# Patient Record
Sex: Female | Born: 2000 | Race: White | Hispanic: No | Marital: Single | Smoking: Never smoker
Health system: Southern US, Community
[De-identification: ages and names within clinical notes are randomized; demographics above are authoritative.]

## PROBLEM LIST (undated history)

## (undated) HISTORY — PX: TONSILLECTOMY: SUR1361

---

## 2018-02-26 ENCOUNTER — Other Ambulatory Visit: Payer: Self-pay

## 2018-02-26 ENCOUNTER — Encounter: Payer: Self-pay | Admitting: Obstetrics & Gynecology

## 2018-02-26 ENCOUNTER — Ambulatory Visit (INDEPENDENT_AMBULATORY_CARE_PROVIDER_SITE_OTHER): Payer: 59 | Admitting: Obstetrics & Gynecology

## 2018-02-26 ENCOUNTER — Emergency Department (HOSPITAL_BASED_OUTPATIENT_CLINIC_OR_DEPARTMENT_OTHER)
Admission: EM | Admit: 2018-02-26 | Discharge: 2018-02-26 | Disposition: A | Discharge status: Home / Self Care | Payer: 59 | Attending: Emergency Medicine | Admitting: Emergency Medicine

## 2018-02-26 ENCOUNTER — Encounter (HOSPITAL_BASED_OUTPATIENT_CLINIC_OR_DEPARTMENT_OTHER): Payer: Self-pay | Admitting: *Deleted

## 2018-02-26 VITALS — BP 113/56 | HR 72 | Ht 68.0 in | Wt 186.0 lb

## 2018-02-26 DIAGNOSIS — R55 Syncope and collapse: Secondary | ICD-10-CM | POA: Insufficient documentation

## 2018-02-26 DIAGNOSIS — Z3202 Encounter for pregnancy test, result negative: Secondary | ICD-10-CM

## 2018-02-26 DIAGNOSIS — Z79899 Other long term (current) drug therapy: Secondary | ICD-10-CM | POA: Insufficient documentation

## 2018-02-26 DIAGNOSIS — N912 Amenorrhea, unspecified: Secondary | ICD-10-CM | POA: Diagnosis not present

## 2018-02-26 DIAGNOSIS — N970 Female infertility associated with anovulation: Secondary | ICD-10-CM | POA: Diagnosis not present

## 2018-02-26 LAB — POCT URINE PREGNANCY: Preg Test, Ur: NEGATIVE

## 2018-02-26 MED ORDER — MEDROXYPROGESTERONE ACETATE 5 MG PO TABS: 5.0000 mg | ORAL_TABLET | Freq: Every day | ORAL | 0 refills | Status: DC

## 2018-02-26 NOTE — Progress Notes (Signed)
Subjective:     Jasmine Stevens is a 17 y.o. female here for a routine exam. LMP ~end 07/2017 Current complaints: no menses for 6 months.  [place] Pt is [employer]. Menarche at age [age]years . Cycles not reg for 2 1/2 years. Pt had menses monthly for 1 year only. Pt had very minimal molimina sx but, she did have mood issues.  Pt denies excessive hair growth. She does not shave or wax hair in any location.   Gynecologic History Patient's last menstrual period was 07/08/2017. Contraception: abstinence Last Pap: n/a.    Obstetric History OB History  Gravida Para Term Preterm AB Living  0 0 0 0 0 0  SAB TAB Ectopic Multiple Live Births  0 0 0 0 0   The following portions of the patient's history were reviewed and updated as appropriate: allergies, current medications, past family history, past medical history, past social history, past surgical history and problem list.  Review of Systems Pertinent items are noted in HPI.    Objective:  BP (!) 113/56   Pulse 72   Ht 5\' 8"  (1.727 m)   Wt 186 lb (84.4 kg)   LMP 07/08/2017   BMI 28.28 kg/m    CONSTITUTIONAL: Well-developed, well-nourished female in no acute distress.  HENT:  Normocephalic, atraumatic EYES: Conjunctivae and EOM are normal. No scleral icterus.  NECK: Normal range of motion SKIN: Skin is warm and dry. No rash noted. Not diaphoretic.No pallor. NEUROLGIC: Alert and oriented to person, place, and time. Normal coordination.  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 This could still be related to a immature HPA as pt begin menses late and only had monthly cycles for 1 year   Plan:   Labs: TSH,. Prolactin, DHEAS, FSH F/u in 3-4 weeks Pelvic US  Total face-to-face time with patient was 25 min.  Greater than 50% was spent in counseling and coordination of care with the patient.  Homer Pfeifer L. Harraway-Smith, M.D., Evern CoreFACOG

## 2018-02-26 NOTE — Patient Instructions (Signed)

## 2018-02-26 NOTE — ED Triage Notes (Addendum)
Patient from [facility].  ED staff called for rapid response.  States that she was having blood drawn and had vasovagal event.  Patient alert and oriented upon ED staff arrival.  Reports loss of consciousness with head injury.  [facility]; HR 56; 99%on RA, [id number]

## 2018-02-26 NOTE — Addendum Note (Signed)
Addended by: Lorelle GibbsWILSON, Vashon Arch L on: 02/26/2018 05:00 PM   Modules accepted: Orders

## 2018-02-26 NOTE — Progress Notes (Signed)
Pt states that she hasn't had a period since the end of April 2019.

## 2018-02-26 NOTE — ED Provider Notes (Signed)
[facility] EMERGENCY DEPARTMENT Provider Note   CSN: [id number] Arrival date & time: 02/26/18  1716     History   Chief Complaint Chief Complaint  Patient presents with  . Loss of Consciousness    HPI Jasmine Stevens is a 17 y.o. female.  Patient is a healthy 17 year old female who was giving blood today upstairs in the clinic when she started to feel lightheaded and woozy.  She had a brief episode of losing consciousness but then thought she was okay and try to get up and walk and passed out again hitting her head on the floor.  She currently has no complaints and states she is feeling better.  She had not eaten much today either.  The history is provided by the patient.  Loss of Consciousness   This is a new problem. The current episode started less than 1 hour ago. The problem occurs constantly. The problem has been resolved. She lost consciousness for a period of less than one minute. Associated with: occured after a blood draw today upstairs in the doctors office. Associated symptoms include diaphoresis, light-headedness and nausea. Treatments tried: lying down. The treatment provided significant relief.    History reviewed. No pertinent past medical history.  There are no active problems to display for this patient.   Past Surgical History:  Procedure Laterality Date  . TONSILLECTOMY       OB History    Gravida  0   Para  0   Term  0   Preterm  0   AB  0   Living  0     SAB  0   TAB  0   Ectopic  0   Multiple  0   Live Births  0            Home Medications    Prior to Admission medications   Medication Sig Start Date End Date Taking? Authorizing Provider  loratadine (CLARITIN) 10 MG tablet Take 10 mg by mouth daily.   Yes [provider]  medroxyPROGESTERone (PROVERA) 5 MG tablet Take 1 tablet (5 mg total) by mouth daily. Use for twelve consecutive days every month while on estrogen therapy 02/26/18   Willodean RosenthalHarraway-Smith,  Carolyn, MD    Family History History reviewed. No pertinent family history.  Social History Social History   Tobacco Use  . Smoking status: Never Smoker  . Smokeless tobacco: Never Used  Substance Use Topics  . Alcohol use: Never    Frequency: Never  . Drug use: Never     Allergies   Patient has no known allergies.   Review of Systems Review of Systems  Constitutional: Positive for diaphoresis.  Cardiovascular: Positive for syncope.  Gastrointestinal: Positive for nausea.  Neurological: Positive for light-headedness.  All other systems reviewed and are negative.    Physical Exam Updated Vital Signs BP 113/60 (BP Location: Right Arm)   Pulse 62   Temp 98.2 F (36.8 C) (Oral)   Resp 18   LMP 08/05/2017   SpO2 100%   Physical Exam Vitals signs and nursing note reviewed.  Constitutional:      General: She is not in acute distress.    Appearance: She is well-developed.  HENT:     Head: Normocephalic. Contusion present. No laceration.   Eyes:     Pupils: Pupils are equal, round, and reactive to light.  Cardiovascular:     Rate and Rhythm: Normal rate and regular rhythm.     Heart sounds:  Normal heart sounds. No murmur. No friction rub.  Pulmonary:     Effort: Pulmonary effort is normal.     Breath sounds: Normal breath sounds. No wheezing or rales.  Abdominal:     General: Bowel sounds are normal. There is no distension.     Palpations: Abdomen is soft.     Tenderness: There is no abdominal tenderness. There is no guarding or rebound.  Musculoskeletal: Normal range of motion.        General: No tenderness.     Comments: No edema  Skin:    General: Skin is warm and dry.     Coloration: Skin is pale.     Findings: No rash.  Neurological:     Mental Status: She is alert and oriented to person, place, and time.     Cranial Nerves: No cranial nerve deficit.  Psychiatric:        Behavior: Behavior normal.      ED Treatments / Results  Labs (all  labs ordered are listed, but only abnormal results are displayed) Labs Reviewed - No data to display  EKG EKG Interpretation  Date/Time:  Wednesday February 26 2018 17:25:40 EST Ventricular Rate:  63 PR Interval:    QRS Duration: 92 QT Interval:  394 QTC Calculation: 404 R Axis:   89 Text Interpretation:  Sinus rhythm Normal ECG Confirmed by Gwyneth Sprout ([id number]) on 02/26/2018 5:29:39 PM   Radiology No results found.  Procedures Procedures (including critical care time)  Medications Ordered in ED Medications - No data to display   Initial Impression / Assessment and Plan / ED Course  I have reviewed the triage vital signs and the nursing notes.  Pertinent labs & imaging results that were available during my care of the patient were reviewed by me and considered in my medical decision making (see chart for details).     Patient presenting from upstairs after having a vasovagal event and hitting her head on the floor.  She has no neck pain or headache at this time.  She now feels normal.  Patient's EKG without acute findings and she had a urine pregnancy test done today in the office which was within normal limits.  Low suspicion for intracranial or cervical injury.  Patient is able to ambulate without return of symptoms.  Feel she is safe for discharge.  Vital signs within normal limits  Final Clinical Impressions(s) / ED Diagnoses   Final diagnoses:  Vasovagal syncope    ED Discharge Orders    None       Gwyneth Sprout, MD 02/26/18 1800

## 2018-02-26 NOTE — ED Notes (Signed)
Pt walked in the dept. Well with a steady gate.

## 2018-02-27 ENCOUNTER — Telehealth: Payer: Self-pay

## 2018-02-27 LAB — PROLACTIN: Prolactin: 7.8 ng/mL (ref 4.8–23.3)

## 2018-02-27 LAB — DHEA-SULFATE: DHEA-SO4: 220.6 ug/dL (ref 110.0–433.2)

## 2018-02-27 LAB — TSH: TSH: 1.77 u[IU]/mL (ref 0.450–4.500)

## 2018-02-27 LAB — FOLLICLE STIMULATING HORMONE: FSH: 6.9 m[IU]/mL

## 2018-02-27 NOTE — Telephone Encounter (Signed)
[facility] needs clarification on Medroxyprogestrone directions. Patient only given #5 but directions read to take for 12 consecutive days. Will route to provider for clarification. Armandina StammerJennifer Howard RN

## 2018-02-28 ENCOUNTER — Other Ambulatory Visit: Payer: Self-pay | Admitting: Obstetrics & Gynecology

## 2018-02-28 DIAGNOSIS — N970 Female infertility associated with anovulation: Secondary | ICD-10-CM

## 2018-02-28 MED ORDER — MEDROXYPROGESTERONE ACETATE 5 MG PO TABS: 5.0000 mg | ORAL_TABLET | Freq: Every day | ORAL | 0 refills | Status: AC

## 2018-03-03 ENCOUNTER — Telehealth: Payer: Self-pay

## 2018-03-03 NOTE — Telephone Encounter (Signed)
-----   Message from Willodean Rosenthalarolyn Harraway-Smith, MD sent at 03/03/2018  9:46 AM EST ----- Please call pt. All of her labs were neg. Will f/u with her after Progestin challenge.   clh-S

## 2018-03-03 NOTE — Telephone Encounter (Signed)
Patient called and made aware that labwork was normal. Patient has ultrasound schedule and follow up scheduled Jan . 10th. Patient plans to follow up then. Armandina StammerJennifer Howard RN

## 2018-03-14 ENCOUNTER — Encounter (HOSPITAL_BASED_OUTPATIENT_CLINIC_OR_DEPARTMENT_OTHER): Payer: Self-pay

## 2018-03-14 ENCOUNTER — Ambulatory Visit (HOSPITAL_BASED_OUTPATIENT_CLINIC_OR_DEPARTMENT_OTHER)
Admission: RE | Admit: 2018-03-14 | Discharge: 2018-03-14 | Disposition: A | Discharge status: Home / Self Care | Payer: 59 | Source: Ambulatory Visit | Attending: Obstetrics & Gynecology | Admitting: Obstetrics & Gynecology

## 2018-03-14 DIAGNOSIS — N970 Female infertility associated with anovulation: Secondary | ICD-10-CM | POA: Diagnosis present

## 2018-03-20 ENCOUNTER — Ambulatory Visit: Payer: 59 | Admitting: Obstetrics & Gynecology

## 2018-03-26 ENCOUNTER — Ambulatory Visit: Payer: 59 | Admitting: Obstetrics & Gynecology

## 2018-04-09 ENCOUNTER — Ambulatory Visit: Payer: 59 | Admitting: Obstetrics & Gynecology

## 2020-03-07 IMAGING — US US PELVIS COMPLETE
1 series · 14 of 25 positions shown · non-contrast
Comparison: None.

CLINICAL DATA: Oligomenorrhea, oligo ovulation

EXAM:
TRANSABDOMINAL ULTRASOUND OF PELVIS
TECHNIQUE: Transabdominal ultrasound examination of the pelvis was performed
including evaluation of the uterus, ovaries, adnexal regions, and
pelvic cul-de-sac. Transvaginal imaging was not performed since
patient denied having ever been sexually active.

[Series 1: us pelvis complete · 0.21mm/px · 14 of 59 slices shown]
[im 1/59]
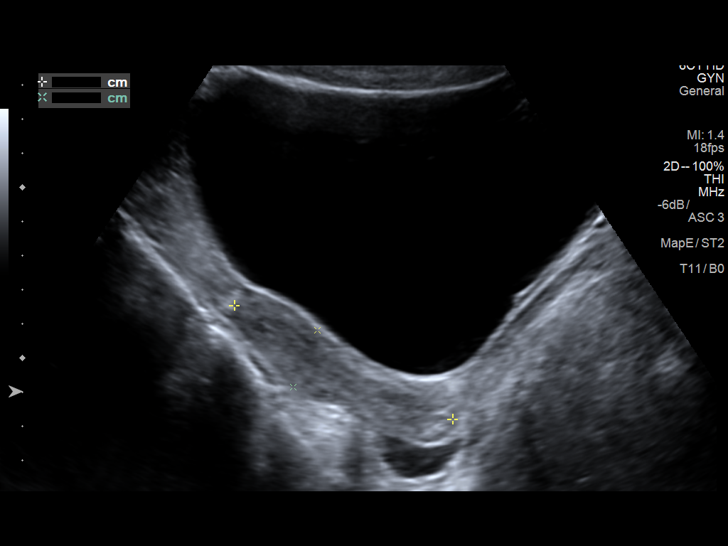
[im 5/59]
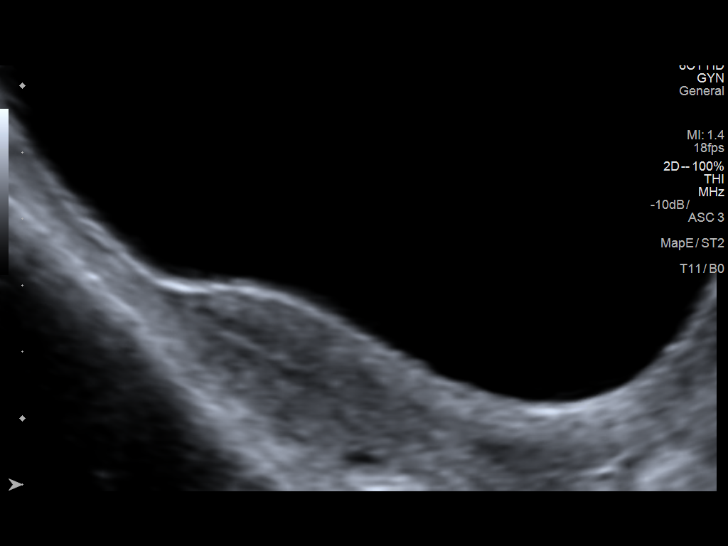
[im 10/59]
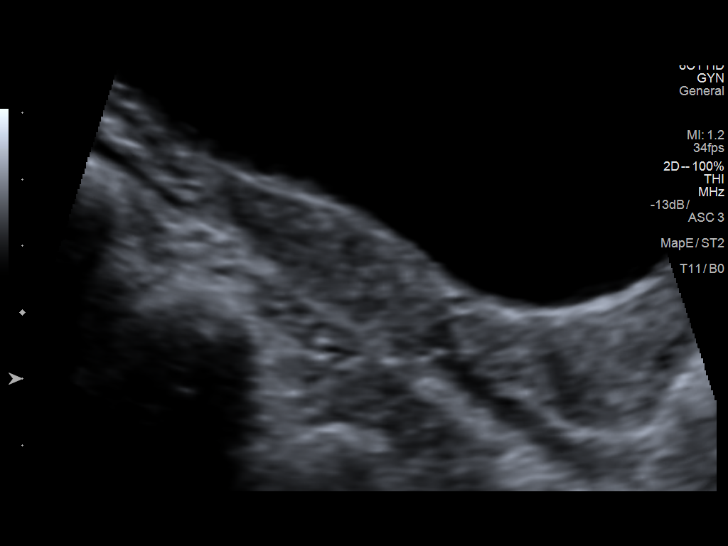
[im 15/59]
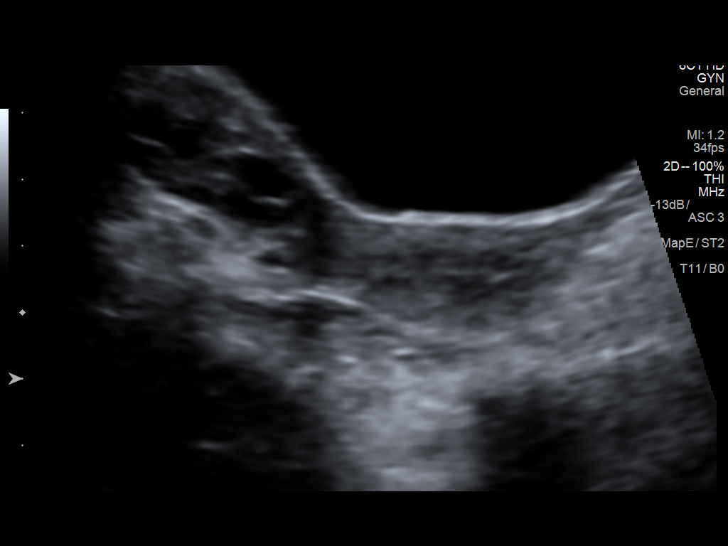
[im 20/59]
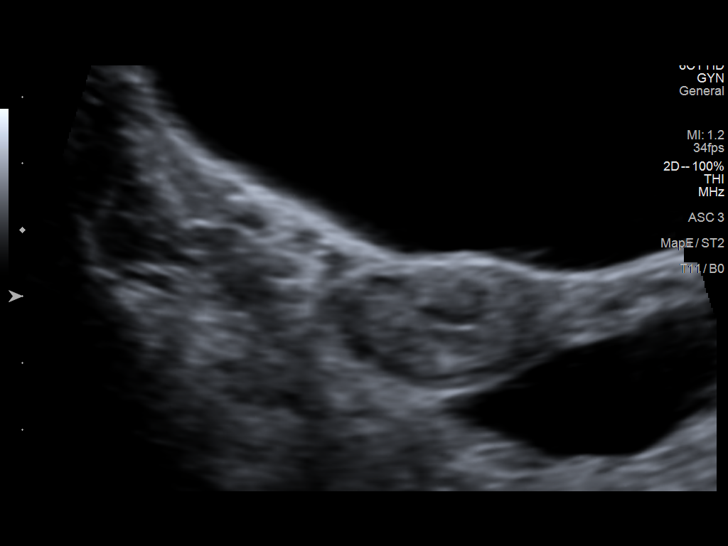
[im 22/59]
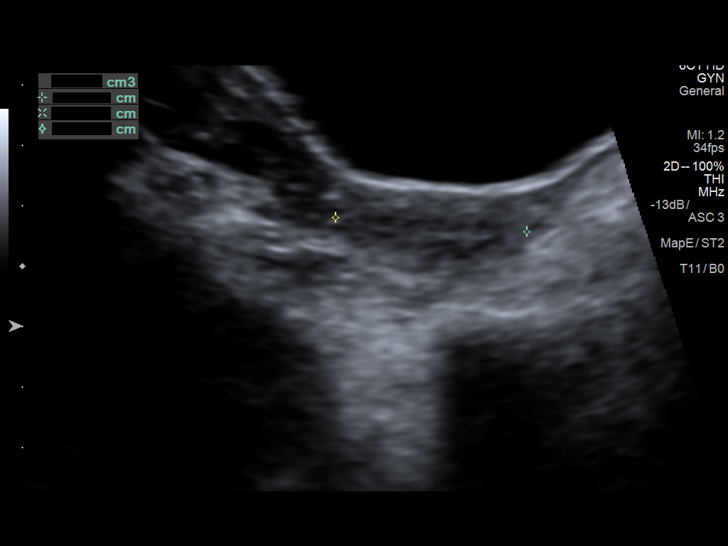
[im 27/59]
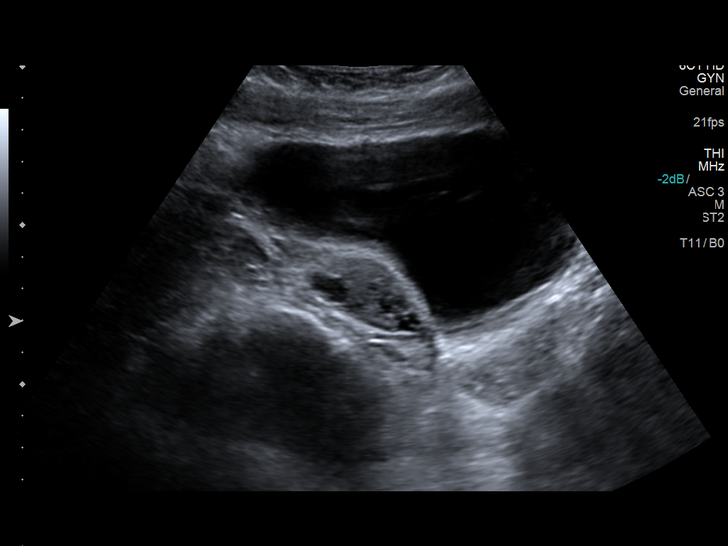
[im 32/59]
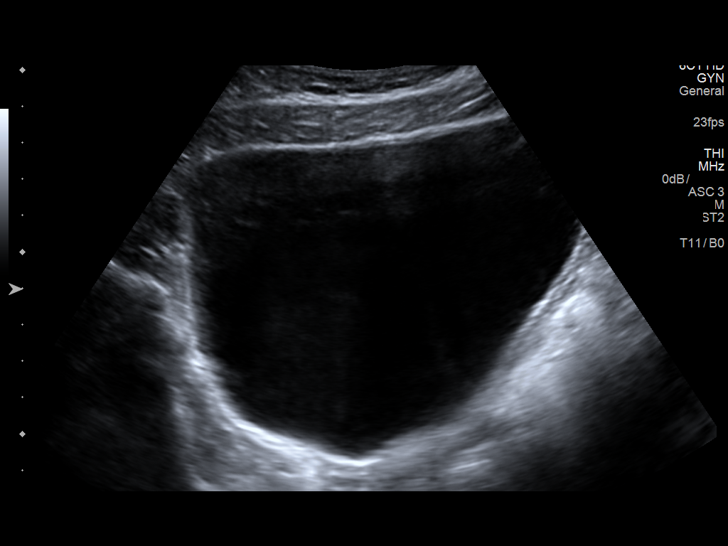
[im 37/59]
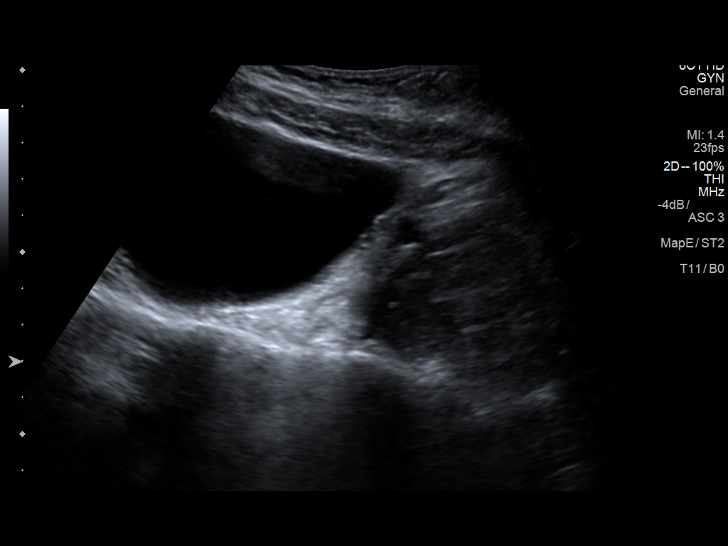
[im 39/59]
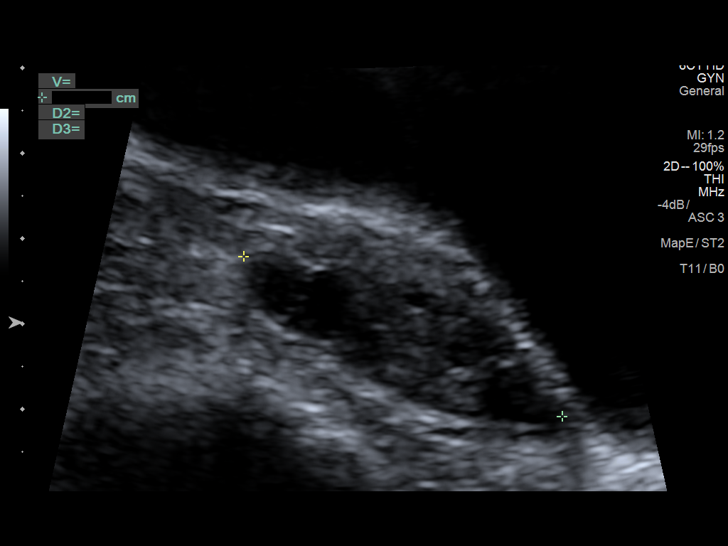
[im 44/59]
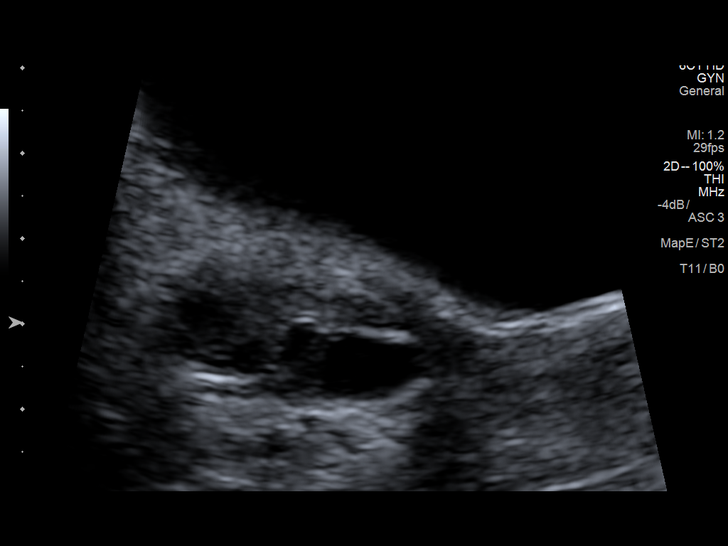
[im 49/59]
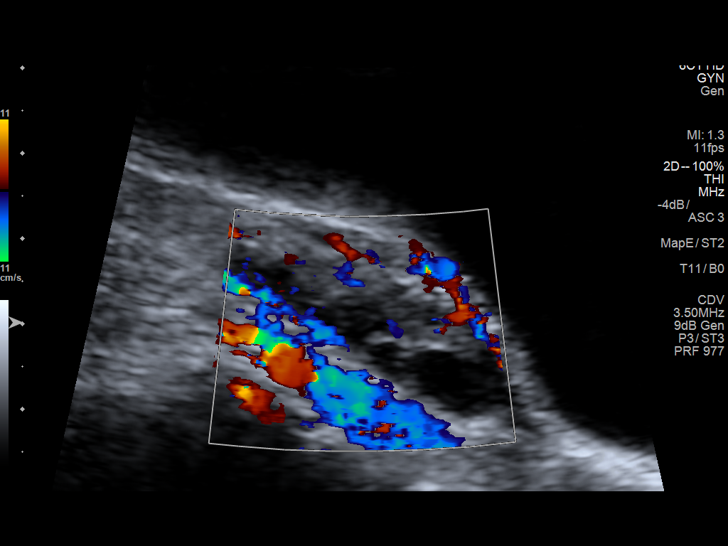
[im 54/59]
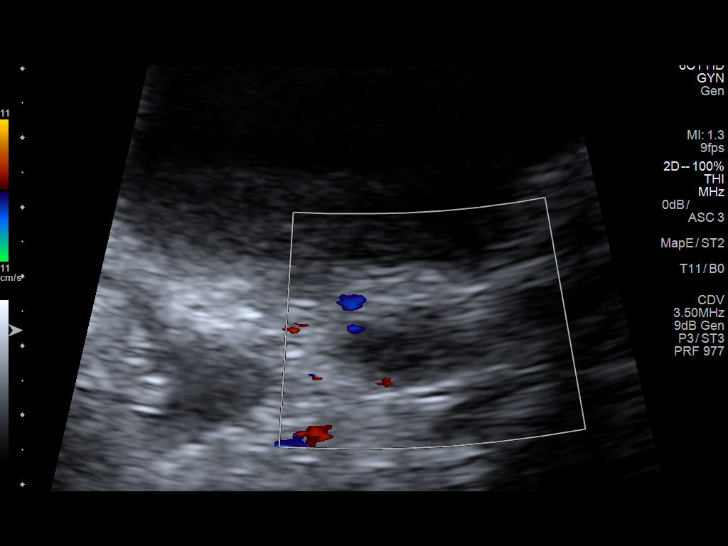
[im 59/59]
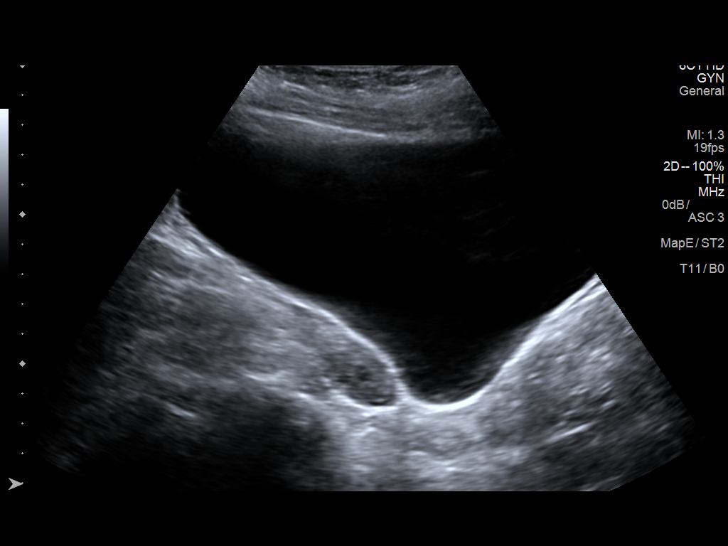

[14 of 25 positions shown; findings below may reference images not displayed]

FINDINGS: Uterus

Measurements: 6.9 x 2.1 x 3.2 cm = volume: 24.0 mL. Normal
morphology without mass.

Endometrium

Thickness: 4 mm, normal.  No new meter full or focal abnormality

Right ovary

Measurements: 4.2 x 1.4 x 3.4 cm = volume: 10.7 mL. Normal
morphology without mass. Blood flow present within RIGHT ovary on
color Doppler imaging.

Left ovary

Measurements: 3.4 x 1.9 x 3.5 cm = volume: 12.1 mL. Normal
morphology without mass. Blood flow present within LEFT ovary on
color Doppler imaging.

Other findings: Small amount of free pelvic fluid. No adnexal
masses.
IMPRESSION: Small amount of nonspecific simple appearing free pelvic fluid.

Otherwise normal exam.
